# Patient Record
Sex: Female | Born: 1935
Health system: Southern US, Community
[De-identification: ages and names within clinical notes are randomized; demographics above are authoritative.]

---

## 1999-01-15 ENCOUNTER — Other Ambulatory Visit: Admission: RE | Admit: 1999-01-15 | Discharge: 1999-01-15 | Payer: Self-pay | Admitting: Obstetrics and Gynecology

## 1999-08-06 ENCOUNTER — Ambulatory Visit (HOSPITAL_COMMUNITY): Admission: RE | Admit: 1999-08-06 | Discharge: 1999-08-06 | Payer: Self-pay | Admitting: Internal Medicine

## 2000-05-04 ENCOUNTER — Ambulatory Visit (HOSPITAL_COMMUNITY): Admission: RE | Admit: 2000-05-04 | Discharge: 2000-05-04 | Payer: Self-pay | Admitting: Gastroenterology

## 2000-05-04 ENCOUNTER — Encounter (INDEPENDENT_AMBULATORY_CARE_PROVIDER_SITE_OTHER): Payer: Self-pay | Admitting: *Deleted

## 2000-06-13 ENCOUNTER — Ambulatory Visit (HOSPITAL_COMMUNITY): Admission: RE | Admit: 2000-06-13 | Discharge: 2000-06-13 | Payer: Self-pay | Admitting: Gastroenterology

## 2000-06-13 ENCOUNTER — Encounter: Payer: Self-pay | Admitting: Gastroenterology

## 2001-01-30 ENCOUNTER — Other Ambulatory Visit: Admission: RE | Admit: 2001-01-30 | Discharge: 2001-01-30 | Payer: Self-pay | Admitting: Obstetrics and Gynecology

## 2001-06-05 ENCOUNTER — Ambulatory Visit (HOSPITAL_COMMUNITY): Admission: RE | Admit: 2001-06-05 | Discharge: 2001-06-05 | Payer: Self-pay | Admitting: Gastroenterology

## 2002-02-16 ENCOUNTER — Encounter: Admission: RE | Admit: 2002-02-16 | Discharge: 2002-02-16 | Payer: Self-pay | Admitting: Obstetrics and Gynecology

## 2002-02-16 ENCOUNTER — Encounter: Payer: Self-pay | Admitting: Obstetrics and Gynecology

## 2005-03-11 ENCOUNTER — Other Ambulatory Visit: Admission: RE | Admit: 2005-03-11 | Discharge: 2005-03-11 | Payer: Self-pay | Admitting: Obstetrics and Gynecology

## 2007-03-27 ENCOUNTER — Other Ambulatory Visit
Admission: RE | Admit: 2007-03-27 | Discharge: 2007-03-27 | Payer: Self-pay | Admitting: Physical Medicine & Rehabilitation

## 2008-05-15 ENCOUNTER — Encounter: Admission: RE | Admit: 2008-05-15 | Discharge: 2008-05-15 | Payer: Self-pay | Admitting: Internal Medicine

## 2009-03-04 DIAGNOSIS — I10 Essential (primary) hypertension: Secondary | ICD-10-CM | POA: Insufficient documentation

## 2009-03-04 DIAGNOSIS — E785 Hyperlipidemia, unspecified: Secondary | ICD-10-CM | POA: Insufficient documentation

## 2009-03-04 DIAGNOSIS — K219 Gastro-esophageal reflux disease without esophagitis: Secondary | ICD-10-CM | POA: Insufficient documentation

## 2009-03-04 DIAGNOSIS — M1990 Unspecified osteoarthritis, unspecified site: Secondary | ICD-10-CM | POA: Insufficient documentation

## 2009-03-19 DIAGNOSIS — K760 Fatty (change of) liver, not elsewhere classified: Secondary | ICD-10-CM | POA: Insufficient documentation

## 2009-03-19 DIAGNOSIS — R7301 Impaired fasting glucose: Secondary | ICD-10-CM | POA: Insufficient documentation

## 2009-04-15 ENCOUNTER — Other Ambulatory Visit: Admission: RE | Admit: 2009-04-15 | Discharge: 2009-04-15 | Payer: Self-pay | Admitting: Obstetrics and Gynecology

## 2009-06-24 DIAGNOSIS — F4320 Adjustment disorder, unspecified: Secondary | ICD-10-CM | POA: Insufficient documentation

## 2010-10-02 NOTE — Procedures (Signed)
[place] [employer]  Patient:    Debra Buckley, Debra Buckley                        MRN: [id number] Proc. Date: 05/04/00 Adm. Date:  [id number] Attending:  Charna Elizabeth CC:         Lennon Alstrom. Felipa Eth, M.D.   Procedure Report  DATE OF BIRTH:  May 17, 1936.  PROCEDURE:  Colonoscopy.  ENDOSCOPIST:  Anselmo Rod, M.D.  INSTRUMENT USED:  Olympus video colonoscope.  INDICATION FOR PROCEDURE:  Guaiac-positive stools in a 75 year old white female.  Rule out colonic polyps, masses, hemorrhoids, etc.  PREPROCEDURE PREPARATION:  Informed consent was procured from the patient. The patient was fasted for eight hours prior to the procedure and prepped with a bottle of magnesium citrate and a gallon of NuLytely the night prior to the procedure.  PREPROCEDURE PHYSICAL:  VITAL SIGNS:  The patient had stable vital signs.  NECK:  Supple.  CHEST:  Clear to auscultation.  S1, S2 regular.  ABDOMEN:  Soft with normal abdominal bowel sounds.  DESCRIPTION OF PROCEDURE:  The patient was placed in the left lateral decubitus position and sedated with an additional 25 mg of Demerol and 2.5 mg of Versed intravenously.  Once the patient was adequately sedate and maintained on low-flow oxygen and continuous cardiac monitoring, the Olympus video colonoscope was advanced from the rectum to the cecum without difficulty.  There was a small amount of residual stool in the right colon and cecum.  The patients position was therefore turned from the left lateral to the supine position to facilitate adequate visualization of the cecal base. The appendiceal orifice and the ileocecal valve were clearly visualized and photographed.  The rest of the colonic mucosa appeared healthy except for a few scattered diverticula, with the predominance of the diverticular change in the left colon.  This was early diverticular disease.  There were small internal and external hemorrhoids seen on retroflexion and  anal inspection, respectively.  No other masses or polyps were recognized.  IMPRESSION: 1. Scattered diverticular disease with predominance of the change in the left    colon. 2. Small, nonbleeding internal and external hemorrhoids. 3. No masses or polyps seen. 4. Some residual stool in the right colon.  RECOMMENDATIONS: 1. The patient has been advised to increase the fluid and fiber in the diet. 2. Repeat guaiac testing will be done on an outpatient basis, and follow up in    the next month. DD:  05/04/00 TD:  05/04/00 Job: [id number]

## 2010-10-02 NOTE — Procedures (Signed)
[place]  Patient:    Debra Buckley, Debra Buckley                        MRN: [id number] Proc. Date: 05/04/00 Adm. Date:  [id number] Attending:  Charna Elizabeth CC:         Lennon Alstrom. Felipa Eth, M.D.   Procedure Report  DATE OF BIRTH:  1935-09-14.  REFERRING PHYSICIAN:  Ravi R. Felipa Eth, M.D.  PROCEDURE PERFORMED:  Esophagogastroduodenoscopy with biopsies.  ENDOSCOPIST:  Anselmo Rod, M.D.  INSTRUMENT USED:  Olympus video panendoscope.  INDICATIONS FOR PROCEDURE:  Epigastric discomfort not responding to PPIs and a history of guaiac positive stools in a 75 year old white female rule out peptic ulcer disease, esophagitis, gastritis, etc.  PREPROCEDURE PREPARATION:  Informed consent was procured from the patient. The patient was fasted for eight hours prior to the procedure.  PREPROCEDURE PHYSICAL:  The patient had stable vital signs.  Neck supple. Chest clear to auscultation.  S1, S2 regular.  Abdomen soft with normal abdominal bowel sounds.  DESCRIPTION OF PROCEDURE:  The patient was placed in left lateral decubitus position and sedated with 75 mg of Demerol and 7.5 mg of Versed intravenously. Once the patient was adequately sedated and maintained on low-flow oxygen and continuous cardiac monitoring, the Olympus video panendoscope was advanced through the mouthpiece, over the tongue, into the esophagus under direct vision.  The entire esophagus appeared normal without evidence of ring, stricture, masses, lesions or esophagitis or Barretts mucosa.  The scope was then advanced to the stomach where multiple small gastric polyps scattered throughout the gastric mucosa.  These were biopsied for pathology.  No erosions, ulcerations or masses were seen besides the gastric polyps mentioned above. There was antral gastritis.  The duodenal bulb and the small bowel distal to the bulb up to 60 cm appeared normal.  There was no outlet obstruction.  The patient tolerated  the procedure well without complications.  IMPRESSION: 1. Antral gastritis. 2. Multiple small sessile gastric polyps biopsied for pathology. 3. No hiatal hernia seen. 4. Normal-appearing esophagus and proximal small bowel.  RECOMMENDATION: 1. Await pathology results. 2. Avoid nonsteroidals. 3. Need proton pump inhibitor for now and follow antireflux measures. 4. Proceed with colonoscopy at this time. DD:  05/04/00 TD:  05/04/00 Job: [id number]

## 2011-01-25 DIAGNOSIS — M545 Low back pain, unspecified: Secondary | ICD-10-CM | POA: Insufficient documentation

## 2011-01-25 DIAGNOSIS — Z2839 Other underimmunization status: Secondary | ICD-10-CM | POA: Insufficient documentation

## 2011-09-17 DIAGNOSIS — J309 Allergic rhinitis, unspecified: Secondary | ICD-10-CM | POA: Insufficient documentation

## 2012-05-29 DIAGNOSIS — N3281 Overactive bladder: Secondary | ICD-10-CM | POA: Insufficient documentation

## 2014-03-12 DIAGNOSIS — M40209 Unspecified kyphosis, site unspecified: Secondary | ICD-10-CM | POA: Insufficient documentation

## 2014-03-12 DIAGNOSIS — H409 Unspecified glaucoma: Secondary | ICD-10-CM | POA: Insufficient documentation

## 2014-03-28 ENCOUNTER — Other Ambulatory Visit: Payer: Self-pay | Admitting: Sports Medicine

## 2014-03-28 DIAGNOSIS — M5410 Radiculopathy, site unspecified: Secondary | ICD-10-CM

## 2014-03-29 ENCOUNTER — Ambulatory Visit
Admission: RE | Admit: 2014-03-29 | Discharge: 2014-03-29 | Disposition: A | Discharge status: Home / Self Care | Payer: Medicare Other | Source: Ambulatory Visit | Attending: Sports Medicine | Admitting: Sports Medicine

## 2014-03-29 DIAGNOSIS — M5410 Radiculopathy, site unspecified: Secondary | ICD-10-CM

## 2015-10-03 DIAGNOSIS — Z0000 Encounter for general adult medical examination without abnormal findings: Secondary | ICD-10-CM | POA: Insufficient documentation

## 2015-10-09 DIAGNOSIS — D692 Other nonthrombocytopenic purpura: Secondary | ICD-10-CM | POA: Insufficient documentation

## 2016-03-29 ENCOUNTER — Other Ambulatory Visit: Payer: Self-pay | Admitting: Obstetrics & Gynecology

## 2016-03-29 DIAGNOSIS — Z1231 Encounter for screening mammogram for malignant neoplasm of breast: Secondary | ICD-10-CM

## 2016-06-01 DIAGNOSIS — Z01419 Encounter for gynecological examination (general) (routine) without abnormal findings: Secondary | ICD-10-CM | POA: Diagnosis not present

## 2016-06-01 DIAGNOSIS — L900 Lichen sclerosus et atrophicus: Secondary | ICD-10-CM | POA: Diagnosis not present

## 2016-06-01 DIAGNOSIS — Z1211 Encounter for screening for malignant neoplasm of colon: Secondary | ICD-10-CM | POA: Diagnosis not present

## 2016-06-02 DIAGNOSIS — L900 Lichen sclerosus et atrophicus: Secondary | ICD-10-CM | POA: Insufficient documentation

## 2016-06-07 ENCOUNTER — Ambulatory Visit
Admission: RE | Admit: 2016-06-07 | Discharge: 2016-06-07 | Disposition: A | Discharge status: Home / Self Care | Payer: PPO | Source: Ambulatory Visit | Attending: Obstetrics & Gynecology | Admitting: Obstetrics & Gynecology

## 2016-06-07 DIAGNOSIS — Z1231 Encounter for screening mammogram for malignant neoplasm of breast: Secondary | ICD-10-CM | POA: Diagnosis not present

## 2016-06-15 ENCOUNTER — Inpatient Hospital Stay
Admission: RE | Admit: 2016-06-15 | Discharge: 2016-06-15 | Disposition: A | Discharge status: Home / Self Care | Payer: Self-pay | Source: Ambulatory Visit | Attending: *Deleted | Admitting: *Deleted

## 2016-06-15 ENCOUNTER — Other Ambulatory Visit: Payer: Self-pay | Admitting: *Deleted

## 2016-06-15 DIAGNOSIS — Z9289 Personal history of other medical treatment: Secondary | ICD-10-CM

## 2016-07-07 DIAGNOSIS — H40003 Preglaucoma, unspecified, bilateral: Secondary | ICD-10-CM | POA: Diagnosis not present

## 2016-09-08 DIAGNOSIS — H40003 Preglaucoma, unspecified, bilateral: Secondary | ICD-10-CM | POA: Diagnosis not present

## 2016-09-22 DIAGNOSIS — H40003 Preglaucoma, unspecified, bilateral: Secondary | ICD-10-CM | POA: Diagnosis not present

## 2016-10-12 DIAGNOSIS — L720 Epidermal cyst: Secondary | ICD-10-CM | POA: Diagnosis not present

## 2016-10-12 DIAGNOSIS — H02826 Cysts of left eye, unspecified eyelid: Secondary | ICD-10-CM | POA: Diagnosis not present

## 2016-10-15 DIAGNOSIS — R7301 Impaired fasting glucose: Secondary | ICD-10-CM | POA: Diagnosis not present

## 2016-10-15 DIAGNOSIS — R8299 Other abnormal findings in urine: Secondary | ICD-10-CM | POA: Diagnosis not present

## 2016-10-15 DIAGNOSIS — E784 Other hyperlipidemia: Secondary | ICD-10-CM | POA: Diagnosis not present

## 2016-10-15 DIAGNOSIS — I10 Essential (primary) hypertension: Secondary | ICD-10-CM | POA: Diagnosis not present

## 2016-10-22 DIAGNOSIS — Z6838 Body mass index (BMI) 38.0-38.9, adult: Secondary | ICD-10-CM | POA: Diagnosis not present

## 2016-10-22 DIAGNOSIS — Z0000 Encounter for general adult medical examination without abnormal findings: Secondary | ICD-10-CM | POA: Diagnosis not present

## 2016-10-22 DIAGNOSIS — Z1389 Encounter for screening for other disorder: Secondary | ICD-10-CM | POA: Diagnosis not present

## 2016-10-22 DIAGNOSIS — J309 Allergic rhinitis, unspecified: Secondary | ICD-10-CM | POA: Diagnosis not present

## 2016-10-22 DIAGNOSIS — I10 Essential (primary) hypertension: Secondary | ICD-10-CM | POA: Diagnosis not present

## 2016-10-22 DIAGNOSIS — K760 Fatty (change of) liver, not elsewhere classified: Secondary | ICD-10-CM | POA: Diagnosis not present

## 2016-10-22 DIAGNOSIS — N3281 Overactive bladder: Secondary | ICD-10-CM | POA: Diagnosis not present

## 2016-10-22 DIAGNOSIS — E784 Other hyperlipidemia: Secondary | ICD-10-CM | POA: Diagnosis not present

## 2016-10-22 DIAGNOSIS — D692 Other nonthrombocytopenic purpura: Secondary | ICD-10-CM | POA: Diagnosis not present

## 2016-10-22 DIAGNOSIS — R7301 Impaired fasting glucose: Secondary | ICD-10-CM | POA: Diagnosis not present

## 2016-10-22 DIAGNOSIS — K219 Gastro-esophageal reflux disease without esophagitis: Secondary | ICD-10-CM | POA: Diagnosis not present

## 2016-11-30 DIAGNOSIS — L900 Lichen sclerosus et atrophicus: Secondary | ICD-10-CM | POA: Diagnosis not present

## 2017-03-22 DIAGNOSIS — H40003 Preglaucoma, unspecified, bilateral: Secondary | ICD-10-CM | POA: Diagnosis not present

## 2017-04-22 DIAGNOSIS — R7301 Impaired fasting glucose: Secondary | ICD-10-CM | POA: Diagnosis not present

## 2017-04-22 DIAGNOSIS — I10 Essential (primary) hypertension: Secondary | ICD-10-CM | POA: Diagnosis not present

## 2017-04-22 DIAGNOSIS — E7849 Other hyperlipidemia: Secondary | ICD-10-CM | POA: Diagnosis not present

## 2017-04-22 DIAGNOSIS — K219 Gastro-esophageal reflux disease without esophagitis: Secondary | ICD-10-CM | POA: Diagnosis not present

## 2017-04-22 DIAGNOSIS — M1990 Unspecified osteoarthritis, unspecified site: Secondary | ICD-10-CM | POA: Diagnosis not present

## 2017-04-22 DIAGNOSIS — Z6838 Body mass index (BMI) 38.0-38.9, adult: Secondary | ICD-10-CM | POA: Diagnosis not present

## 2017-04-22 DIAGNOSIS — K760 Fatty (change of) liver, not elsewhere classified: Secondary | ICD-10-CM | POA: Diagnosis not present

## 2017-04-22 DIAGNOSIS — J309 Allergic rhinitis, unspecified: Secondary | ICD-10-CM | POA: Diagnosis not present

## 2017-05-05 ENCOUNTER — Other Ambulatory Visit: Payer: Self-pay | Admitting: Obstetrics & Gynecology

## 2017-05-05 DIAGNOSIS — Z1231 Encounter for screening mammogram for malignant neoplasm of breast: Secondary | ICD-10-CM

## 2017-06-10 ENCOUNTER — Ambulatory Visit
Admission: RE | Admit: 2017-06-10 | Discharge: 2017-06-10 | Disposition: A | Discharge status: Home / Self Care | Payer: PPO | Source: Ambulatory Visit | Attending: Obstetrics & Gynecology | Admitting: Obstetrics & Gynecology

## 2017-06-10 DIAGNOSIS — Z1231 Encounter for screening mammogram for malignant neoplasm of breast: Secondary | ICD-10-CM | POA: Diagnosis not present

## 2017-06-23 DIAGNOSIS — Z01411 Encounter for gynecological examination (general) (routine) with abnormal findings: Secondary | ICD-10-CM | POA: Diagnosis not present

## 2017-06-23 DIAGNOSIS — L900 Lichen sclerosus et atrophicus: Secondary | ICD-10-CM | POA: Diagnosis not present

## 2017-06-23 DIAGNOSIS — Z1211 Encounter for screening for malignant neoplasm of colon: Secondary | ICD-10-CM | POA: Diagnosis not present

## 2017-11-18 DIAGNOSIS — E7849 Other hyperlipidemia: Secondary | ICD-10-CM | POA: Diagnosis not present

## 2017-11-18 DIAGNOSIS — R7301 Impaired fasting glucose: Secondary | ICD-10-CM | POA: Diagnosis not present

## 2017-11-18 DIAGNOSIS — I10 Essential (primary) hypertension: Secondary | ICD-10-CM | POA: Diagnosis not present

## 2017-11-18 DIAGNOSIS — Z13820 Encounter for screening for osteoporosis: Secondary | ICD-10-CM | POA: Diagnosis not present

## 2017-11-18 DIAGNOSIS — R82998 Other abnormal findings in urine: Secondary | ICD-10-CM | POA: Diagnosis not present

## 2017-11-25 DIAGNOSIS — Z6838 Body mass index (BMI) 38.0-38.9, adult: Secondary | ICD-10-CM | POA: Diagnosis not present

## 2017-11-25 DIAGNOSIS — I499 Cardiac arrhythmia, unspecified: Secondary | ICD-10-CM | POA: Diagnosis not present

## 2017-11-25 DIAGNOSIS — D692 Other nonthrombocytopenic purpura: Secondary | ICD-10-CM | POA: Diagnosis not present

## 2017-11-25 DIAGNOSIS — I10 Essential (primary) hypertension: Secondary | ICD-10-CM | POA: Diagnosis not present

## 2017-11-25 DIAGNOSIS — K760 Fatty (change of) liver, not elsewhere classified: Secondary | ICD-10-CM | POA: Diagnosis not present

## 2017-11-25 DIAGNOSIS — R7301 Impaired fasting glucose: Secondary | ICD-10-CM | POA: Diagnosis not present

## 2017-11-25 DIAGNOSIS — E7849 Other hyperlipidemia: Secondary | ICD-10-CM | POA: Diagnosis not present

## 2017-11-25 DIAGNOSIS — Z1212 Encounter for screening for malignant neoplasm of rectum: Secondary | ICD-10-CM | POA: Diagnosis not present

## 2017-11-25 DIAGNOSIS — Z1389 Encounter for screening for other disorder: Secondary | ICD-10-CM | POA: Diagnosis not present

## 2017-11-25 DIAGNOSIS — Z0000 Encounter for general adult medical examination without abnormal findings: Secondary | ICD-10-CM | POA: Diagnosis not present

## 2017-11-25 DIAGNOSIS — N3281 Overactive bladder: Secondary | ICD-10-CM | POA: Diagnosis not present

## 2017-11-25 DIAGNOSIS — K219 Gastro-esophageal reflux disease without esophagitis: Secondary | ICD-10-CM | POA: Diagnosis not present

## 2018-01-03 DIAGNOSIS — L900 Lichen sclerosus et atrophicus: Secondary | ICD-10-CM | POA: Diagnosis not present

## 2018-01-03 DIAGNOSIS — B372 Candidiasis of skin and nail: Secondary | ICD-10-CM | POA: Diagnosis not present

## 2018-04-18 DIAGNOSIS — H40003 Preglaucoma, unspecified, bilateral: Secondary | ICD-10-CM | POA: Diagnosis not present

## 2018-05-05 ENCOUNTER — Other Ambulatory Visit: Payer: Self-pay | Admitting: Obstetrics & Gynecology

## 2018-05-05 DIAGNOSIS — Z1231 Encounter for screening mammogram for malignant neoplasm of breast: Secondary | ICD-10-CM

## 2018-05-24 DIAGNOSIS — Z6838 Body mass index (BMI) 38.0-38.9, adult: Secondary | ICD-10-CM | POA: Diagnosis not present

## 2018-05-24 DIAGNOSIS — I499 Cardiac arrhythmia, unspecified: Secondary | ICD-10-CM | POA: Diagnosis not present

## 2018-05-24 DIAGNOSIS — K130 Diseases of lips: Secondary | ICD-10-CM | POA: Diagnosis not present

## 2018-05-24 DIAGNOSIS — I10 Essential (primary) hypertension: Secondary | ICD-10-CM | POA: Diagnosis not present

## 2018-05-24 DIAGNOSIS — R7301 Impaired fasting glucose: Secondary | ICD-10-CM | POA: Diagnosis not present

## 2018-05-24 DIAGNOSIS — J3089 Other allergic rhinitis: Secondary | ICD-10-CM | POA: Diagnosis not present

## 2018-06-12 ENCOUNTER — Ambulatory Visit
Admission: RE | Admit: 2018-06-12 | Discharge: 2018-06-12 | Disposition: A | Discharge status: Home / Self Care | Payer: PPO | Source: Ambulatory Visit | Attending: Obstetrics & Gynecology | Admitting: Obstetrics & Gynecology

## 2018-06-12 DIAGNOSIS — Z1231 Encounter for screening mammogram for malignant neoplasm of breast: Secondary | ICD-10-CM | POA: Diagnosis not present

## 2018-07-04 DIAGNOSIS — L900 Lichen sclerosus et atrophicus: Secondary | ICD-10-CM | POA: Diagnosis not present

## 2018-07-04 DIAGNOSIS — Z1211 Encounter for screening for malignant neoplasm of colon: Secondary | ICD-10-CM | POA: Diagnosis not present

## 2018-07-04 DIAGNOSIS — Z01419 Encounter for gynecological examination (general) (routine) without abnormal findings: Secondary | ICD-10-CM | POA: Diagnosis not present

## 2018-10-04 DIAGNOSIS — W19XXXA Unspecified fall, initial encounter: Secondary | ICD-10-CM | POA: Diagnosis not present

## 2018-10-04 DIAGNOSIS — M1811 Unilateral primary osteoarthritis of first carpometacarpal joint, right hand: Secondary | ICD-10-CM | POA: Diagnosis not present

## 2018-10-04 DIAGNOSIS — S63641A Sprain of metacarpophalangeal joint of right thumb, initial encounter: Secondary | ICD-10-CM | POA: Diagnosis not present

## 2018-10-04 DIAGNOSIS — S6991XA Unspecified injury of right wrist, hand and finger(s), initial encounter: Secondary | ICD-10-CM | POA: Diagnosis not present

## 2019-01-02 DIAGNOSIS — L900 Lichen sclerosus et atrophicus: Secondary | ICD-10-CM | POA: Diagnosis not present

## 2019-01-03 DIAGNOSIS — E7849 Other hyperlipidemia: Secondary | ICD-10-CM | POA: Diagnosis not present

## 2019-01-03 DIAGNOSIS — R7301 Impaired fasting glucose: Secondary | ICD-10-CM | POA: Diagnosis not present

## 2019-01-08 DIAGNOSIS — N3281 Overactive bladder: Secondary | ICD-10-CM | POA: Diagnosis not present

## 2019-01-08 DIAGNOSIS — D692 Other nonthrombocytopenic purpura: Secondary | ICD-10-CM | POA: Diagnosis not present

## 2019-01-08 DIAGNOSIS — K760 Fatty (change of) liver, not elsewhere classified: Secondary | ICD-10-CM | POA: Diagnosis not present

## 2019-01-08 DIAGNOSIS — I10 Essential (primary) hypertension: Secondary | ICD-10-CM | POA: Diagnosis not present

## 2019-01-08 DIAGNOSIS — H409 Unspecified glaucoma: Secondary | ICD-10-CM | POA: Diagnosis not present

## 2019-01-08 DIAGNOSIS — I499 Cardiac arrhythmia, unspecified: Secondary | ICD-10-CM | POA: Diagnosis not present

## 2019-01-08 DIAGNOSIS — K219 Gastro-esophageal reflux disease without esophagitis: Secondary | ICD-10-CM | POA: Diagnosis not present

## 2019-01-08 DIAGNOSIS — E785 Hyperlipidemia, unspecified: Secondary | ICD-10-CM | POA: Diagnosis not present

## 2019-01-08 DIAGNOSIS — Z0000 Encounter for general adult medical examination without abnormal findings: Secondary | ICD-10-CM | POA: Diagnosis not present

## 2019-01-08 DIAGNOSIS — R7301 Impaired fasting glucose: Secondary | ICD-10-CM | POA: Diagnosis not present

## 2019-01-08 DIAGNOSIS — Z1331 Encounter for screening for depression: Secondary | ICD-10-CM | POA: Diagnosis not present

## 2019-02-02 DIAGNOSIS — Z1212 Encounter for screening for malignant neoplasm of rectum: Secondary | ICD-10-CM | POA: Diagnosis not present

## 2019-02-15 ENCOUNTER — Other Ambulatory Visit: Payer: Self-pay

## 2019-02-15 DIAGNOSIS — Z20828 Contact with and (suspected) exposure to other viral communicable diseases: Secondary | ICD-10-CM | POA: Diagnosis not present

## 2019-02-15 DIAGNOSIS — Z20822 Contact with and (suspected) exposure to covid-19: Secondary | ICD-10-CM

## 2019-02-17 LAB — NOVEL CORONAVIRUS, NAA: SARS-CoV-2, NAA: NOT DETECTED

## 2019-02-19 ENCOUNTER — Telehealth: Payer: Self-pay | Admitting: Internal Medicine

## 2019-02-19 NOTE — Telephone Encounter (Signed)
Patient called in and received her covid test result  °

## 2019-04-20 DIAGNOSIS — H40003 Preglaucoma, unspecified, bilateral: Secondary | ICD-10-CM | POA: Diagnosis not present

## 2019-04-25 DIAGNOSIS — H40003 Preglaucoma, unspecified, bilateral: Secondary | ICD-10-CM | POA: Diagnosis not present

## 2019-05-21 ENCOUNTER — Other Ambulatory Visit: Payer: Self-pay | Admitting: Internal Medicine

## 2019-05-21 DIAGNOSIS — Z1231 Encounter for screening mammogram for malignant neoplasm of breast: Secondary | ICD-10-CM

## 2019-06-20 ENCOUNTER — Ambulatory Visit
Admission: RE | Admit: 2019-06-20 | Discharge: 2019-06-20 | Disposition: A | Discharge status: Home / Self Care | Payer: PPO | Source: Ambulatory Visit | Attending: Internal Medicine | Admitting: Internal Medicine

## 2019-06-20 DIAGNOSIS — Z1231 Encounter for screening mammogram for malignant neoplasm of breast: Secondary | ICD-10-CM

## 2019-06-26 DIAGNOSIS — G4700 Insomnia, unspecified: Secondary | ICD-10-CM | POA: Insufficient documentation

## 2019-06-26 DIAGNOSIS — M1990 Unspecified osteoarthritis, unspecified site: Secondary | ICD-10-CM | POA: Diagnosis not present

## 2019-06-26 DIAGNOSIS — R279 Unspecified lack of coordination: Secondary | ICD-10-CM | POA: Insufficient documentation

## 2019-06-26 DIAGNOSIS — K760 Fatty (change of) liver, not elsewhere classified: Secondary | ICD-10-CM | POA: Diagnosis not present

## 2019-06-26 DIAGNOSIS — K219 Gastro-esophageal reflux disease without esophagitis: Secondary | ICD-10-CM | POA: Diagnosis not present

## 2019-06-26 DIAGNOSIS — E785 Hyperlipidemia, unspecified: Secondary | ICD-10-CM | POA: Diagnosis not present

## 2019-06-26 DIAGNOSIS — R7301 Impaired fasting glucose: Secondary | ICD-10-CM | POA: Diagnosis not present

## 2019-06-26 DIAGNOSIS — I10 Essential (primary) hypertension: Secondary | ICD-10-CM | POA: Diagnosis not present

## 2019-07-10 DIAGNOSIS — L900 Lichen sclerosus et atrophicus: Secondary | ICD-10-CM | POA: Diagnosis not present

## 2019-07-10 DIAGNOSIS — E559 Vitamin D deficiency, unspecified: Secondary | ICD-10-CM | POA: Diagnosis not present

## 2019-07-10 DIAGNOSIS — Z23 Encounter for immunization: Secondary | ICD-10-CM | POA: Diagnosis not present

## 2019-07-10 DIAGNOSIS — Z1331 Encounter for screening for depression: Secondary | ICD-10-CM | POA: Diagnosis not present

## 2019-07-10 DIAGNOSIS — Z1211 Encounter for screening for malignant neoplasm of colon: Secondary | ICD-10-CM | POA: Diagnosis not present

## 2019-07-10 DIAGNOSIS — Z0000 Encounter for general adult medical examination without abnormal findings: Secondary | ICD-10-CM | POA: Diagnosis not present

## 2019-07-10 DIAGNOSIS — Z01419 Encounter for gynecological examination (general) (routine) without abnormal findings: Secondary | ICD-10-CM | POA: Diagnosis not present

## 2019-08-07 DIAGNOSIS — H903 Sensorineural hearing loss, bilateral: Secondary | ICD-10-CM | POA: Diagnosis not present

## 2019-08-09 DIAGNOSIS — H903 Sensorineural hearing loss, bilateral: Secondary | ICD-10-CM | POA: Diagnosis not present

## 2019-09-24 DIAGNOSIS — L820 Inflamed seborrheic keratosis: Secondary | ICD-10-CM | POA: Diagnosis not present

## 2019-12-18 DIAGNOSIS — L900 Lichen sclerosus et atrophicus: Secondary | ICD-10-CM | POA: Diagnosis not present

## 2020-01-28 DIAGNOSIS — R7301 Impaired fasting glucose: Secondary | ICD-10-CM | POA: Diagnosis not present

## 2020-01-28 DIAGNOSIS — I10 Essential (primary) hypertension: Secondary | ICD-10-CM | POA: Diagnosis not present

## 2020-01-28 DIAGNOSIS — E785 Hyperlipidemia, unspecified: Secondary | ICD-10-CM | POA: Diagnosis not present

## 2020-02-04 DIAGNOSIS — E785 Hyperlipidemia, unspecified: Secondary | ICD-10-CM | POA: Diagnosis not present

## 2020-02-04 DIAGNOSIS — I10 Essential (primary) hypertension: Secondary | ICD-10-CM | POA: Diagnosis not present

## 2020-02-04 DIAGNOSIS — Z23 Encounter for immunization: Secondary | ICD-10-CM | POA: Diagnosis not present

## 2020-02-04 DIAGNOSIS — M40209 Unspecified kyphosis, site unspecified: Secondary | ICD-10-CM | POA: Diagnosis not present

## 2020-02-04 DIAGNOSIS — R82998 Other abnormal findings in urine: Secondary | ICD-10-CM | POA: Diagnosis not present

## 2020-02-04 DIAGNOSIS — R279 Unspecified lack of coordination: Secondary | ICD-10-CM | POA: Diagnosis not present

## 2020-02-04 DIAGNOSIS — Z0000 Encounter for general adult medical examination without abnormal findings: Secondary | ICD-10-CM | POA: Diagnosis not present

## 2020-02-04 DIAGNOSIS — F4320 Adjustment disorder, unspecified: Secondary | ICD-10-CM | POA: Diagnosis not present

## 2020-02-04 DIAGNOSIS — D692 Other nonthrombocytopenic purpura: Secondary | ICD-10-CM | POA: Diagnosis not present

## 2020-02-04 DIAGNOSIS — M545 Low back pain: Secondary | ICD-10-CM | POA: Diagnosis not present

## 2020-02-04 DIAGNOSIS — K760 Fatty (change of) liver, not elsewhere classified: Secondary | ICD-10-CM | POA: Diagnosis not present

## 2020-02-04 DIAGNOSIS — N3281 Overactive bladder: Secondary | ICD-10-CM | POA: Diagnosis not present

## 2020-02-23 DIAGNOSIS — S46811A Strain of other muscles, fascia and tendons at shoulder and upper arm level, right arm, initial encounter: Secondary | ICD-10-CM | POA: Diagnosis not present

## 2020-03-20 DIAGNOSIS — L821 Other seborrheic keratosis: Secondary | ICD-10-CM | POA: Diagnosis not present

## 2020-03-20 DIAGNOSIS — D224 Melanocytic nevi of scalp and neck: Secondary | ICD-10-CM | POA: Diagnosis not present

## 2020-04-23 DIAGNOSIS — H40003 Preglaucoma, unspecified, bilateral: Secondary | ICD-10-CM | POA: Diagnosis not present

## 2020-05-01 DIAGNOSIS — H40003 Preglaucoma, unspecified, bilateral: Secondary | ICD-10-CM | POA: Diagnosis not present

## 2020-06-06 ENCOUNTER — Other Ambulatory Visit: Payer: Self-pay | Admitting: Obstetrics & Gynecology

## 2020-06-06 DIAGNOSIS — Z1231 Encounter for screening mammogram for malignant neoplasm of breast: Secondary | ICD-10-CM

## 2020-08-13 ENCOUNTER — Other Ambulatory Visit: Payer: Self-pay

## 2020-08-13 ENCOUNTER — Ambulatory Visit
Admission: RE | Admit: 2020-08-13 | Discharge: 2020-08-13 | Disposition: A | Discharge status: Home / Self Care | Payer: Medicare Other | Source: Ambulatory Visit | Attending: Obstetrics & Gynecology | Admitting: Obstetrics & Gynecology

## 2020-08-13 DIAGNOSIS — Z1231 Encounter for screening mammogram for malignant neoplasm of breast: Secondary | ICD-10-CM | POA: Insufficient documentation

## 2021-07-07 ENCOUNTER — Other Ambulatory Visit: Payer: Self-pay | Admitting: Obstetrics and Gynecology

## 2021-07-07 DIAGNOSIS — Z1231 Encounter for screening mammogram for malignant neoplasm of breast: Secondary | ICD-10-CM

## 2021-07-29 ENCOUNTER — Ambulatory Visit (INDEPENDENT_AMBULATORY_CARE_PROVIDER_SITE_OTHER): Payer: Medicare Other

## 2021-07-29 ENCOUNTER — Other Ambulatory Visit: Payer: Self-pay

## 2021-07-29 ENCOUNTER — Encounter: Payer: Self-pay | Admitting: Podiatry

## 2021-07-29 ENCOUNTER — Ambulatory Visit: Payer: Medicare Other | Admitting: Podiatry

## 2021-07-29 DIAGNOSIS — L97521 Non-pressure chronic ulcer of other part of left foot limited to breakdown of skin: Secondary | ICD-10-CM | POA: Diagnosis not present

## 2021-07-29 DIAGNOSIS — M5416 Radiculopathy, lumbar region: Secondary | ICD-10-CM | POA: Insufficient documentation

## 2021-07-29 DIAGNOSIS — M2042 Other hammer toe(s) (acquired), left foot: Secondary | ICD-10-CM | POA: Diagnosis not present

## 2021-07-29 DIAGNOSIS — L03032 Cellulitis of left toe: Secondary | ICD-10-CM | POA: Diagnosis not present

## 2021-07-29 DIAGNOSIS — L02612 Cutaneous abscess of left foot: Secondary | ICD-10-CM

## 2021-07-29 MED ORDER — MUPIROCIN 2 % EX OINT: 1.0000 "application " | TOPICAL_OINTMENT | Freq: Two times a day (BID) | CUTANEOUS | 0 refills | Status: DC

## 2021-07-29 MED ORDER — AMOXICILLIN-POT CLAVULANATE 875-125 MG PO TABS: 1.0000 | ORAL_TABLET | Freq: Two times a day (BID) | ORAL | 1 refills | Status: DC

## 2021-07-29 NOTE — Progress Notes (Signed)
?Subjective:  ?Patient ID: Debra Buckley, female    DOB: 1935-12-23,  MRN: [id number] ?HPI ?Chief Complaint  ?Patient presents with  ? Toe Pain  ?  2nd toe left - aching x several months, last week got red and swollen, callused tip and new sore next to the toenail, tried diclofenac gel - no help  ? New Patient (Initial Visit)  ? ? ?86 y.o. female presents with the above complaint.  ? ?ROS: Denies fever chills nausea vomiting muscle aches pains calf pain back pain chest pain shortness of breath. ? ?No past medical history on file. ?No past surgical history on file. ? ?Current Outpatient Medications:  ?  amoxicillin-clavulanate (AUGMENTIN) 875-125 MG tablet, Take 1 tablet by mouth 2 (two) times daily., Disp: 20 tablet, Rfl: 1 ?  aspirin EC 81 MG tablet, Take 81 mg by mouth daily. Swallow whole., Disp: , Rfl:  ?  BIOTIN PO, Take by mouth., Disp: , Rfl:  ?  Calcium-Magnesium-Vitamin D (CALCIUM 1200+D3 PO), Take by mouth., Disp: , Rfl:  ?  Cholecalciferol (D3 PO), Take by mouth., Disp: , Rfl:  ?  colestipol (COLESTID) 1 g tablet, Take 1 g by mouth 2 (two) times daily., Disp: , Rfl:  ?  FOLIC ACID PO, Take by mouth., Disp: , Rfl:  ?  MELATONIN PO, Take by mouth., Disp: , Rfl:  ?  mupirocin ointment (BACTROBAN) 2 %, Apply 1 application. topically 2 (two) times daily., Disp: 22 g, Rfl: 0 ?  Nutritional Supplements (GLUCOSAMINE COMPLEX PO), Take by mouth., Disp: , Rfl:  ?  gabapentin (NEURONTIN) 300 MG capsule, Take by mouth., Disp: , Rfl:  ?  irbesartan-hydrochlorothiazide (AVALIDE) 300-12.5 MG tablet, Take 1 tablet by mouth daily., Disp: , Rfl:  ?  omeprazole (PRILOSEC) 40 MG capsule, Take 40 mg by mouth daily., Disp: , Rfl:  ?  traMADol (ULTRAM) 50 MG tablet, Take 50 mg by mouth every 8 (eight) hours as needed., Disp: , Rfl:  ? ?Allergies  ?Allergen Reactions  ? Monascus Purpureus Went Yeast   ?  Other reaction(s): Myalgia  ? Statins   ?  Other reaction(s): Myalgia  ? Phenobarbital Itching and Rash  ? ?Review of  Systems ?Objective:  ?There were no vitals filed for this visit. ? ?General: Well developed, nourished, in no acute distress, alert and oriented x3  ? ?Dermatological: Skin is warm, dry and supple bilateral. Nails x 10 are well maintained; remaining integument appears unremarkable at this time. There are no open sores, no preulcerative lesions, no rash or signs of infection present.  Cellulitis with superficial ulceration to the distal and distal lateral aspect of the second digit left foot.  This is due to hammertoe deformity there was some purulence noted and a sample of this was sent for Gram stain with culture and sensitivity. ? ?Vascular: Dorsalis Pedis artery and Posterior Tibial artery pedal pulses are 2/4 bilateral with immedate capillary fill time. Pedal hair growth present. No varicosities and no lower extremity edema present bilateral.  ? ?Neruologic: Grossly intact via light touch bilateral. Vibratory intact via tuning fork bilateral. Protective threshold with Semmes Wienstein monofilament intact to all pedal sites bilateral. Patellar and Achilles deep tendon reflexes 2+ bilateral. No Babinski or clonus noted bilateral.  ? ?Musculoskeletal: No gross boney pedal deformities bilateral. No pain, crepitus, or limitation noted with foot and ankle range of motion bilateral. Muscular strength 5/5 in all groups tested bilateral.  Hammertoe deformity and a distal ulceration second toe left foot ? ?Gait:  Unassisted, Nonantalgic.  ? ? ?Radiographs: ? ?Radiographs demonstrate severe hammertoe deformities with no signs of osteoarthritis forefoot. ? ?Assessment & Plan:  ? ?Assessment: Hammertoe deformity with distal clavus and ulceration with cellulitis to the PIPJ ? ?Plan: Debridement of the wound today culture and sensitivity taken wrote a prescription for Bactroban ointment and Augmentin she will soak in Epsom salts and warm water apply dressing daily with Bactroban ointment follow-up with me in 1 week to make sure  she is improving we also provided her with a buttress pad to keep the toes from rubbing as much. ? ? ? ? ?Raney Koeppen T. [place], DPM ?

## 2021-08-02 LAB — WOUND CULTURE

## 2021-08-05 ENCOUNTER — Ambulatory Visit: Payer: Medicare Other | Admitting: Podiatry

## 2021-08-05 ENCOUNTER — Other Ambulatory Visit: Payer: Self-pay

## 2021-08-05 ENCOUNTER — Encounter: Payer: Self-pay | Admitting: Podiatry

## 2021-08-05 DIAGNOSIS — L97521 Non-pressure chronic ulcer of other part of left foot limited to breakdown of skin: Secondary | ICD-10-CM | POA: Diagnosis not present

## 2021-08-05 NOTE — Progress Notes (Signed)
She presents today for follow-up of her ulceration second toe left foot states that is doing a lot better. ? ?Objective: Vital signs are stable she is alert and oriented x3 much decrease in erythema no cellulitis drainage or odor continues to wear her buttress pad.  I debrided the ulceration does demonstrate a small 2 mm opening but has improved considerably.  Her culture and sensitivity came back positive for E. coli which was sensitive to Augmentin. ? ?Assessment: Hammertoe deformity distal clavus and ulceration. ? ?Plan: Debrided today she will continue the Augmentin continue soaking the dressing on a daily basis with Bactroban follow-up with her in about 2 to 3 weeks. ?

## 2021-08-14 ENCOUNTER — Ambulatory Visit
Admission: RE | Admit: 2021-08-14 | Discharge: 2021-08-14 | Disposition: A | Discharge status: Home / Self Care | Payer: Medicare Other | Source: Ambulatory Visit | Attending: Obstetrics and Gynecology | Admitting: Obstetrics and Gynecology

## 2021-08-14 DIAGNOSIS — Z1231 Encounter for screening mammogram for malignant neoplasm of breast: Secondary | ICD-10-CM | POA: Diagnosis present

## 2021-08-19 ENCOUNTER — Encounter: Payer: Self-pay | Admitting: Podiatry

## 2021-08-19 ENCOUNTER — Ambulatory Visit: Payer: Medicare Other | Admitting: Podiatry

## 2021-08-19 DIAGNOSIS — L97521 Non-pressure chronic ulcer of other part of left foot limited to breakdown of skin: Secondary | ICD-10-CM

## 2021-08-19 MED ORDER — AMOXICILLIN-POT CLAVULANATE 875-125 MG PO TABS: 1.0000 | ORAL_TABLET | Freq: Two times a day (BID) | ORAL | 0 refills | Status: DC

## 2021-08-19 NOTE — Progress Notes (Signed)
She presents today for follow-up of ulceration to the distal aspect of the second toe left foot states that is doing much better she is very happy with the outcome thus far she states is not well yet but is doing better. ? ?Objective: Pulses are palpable left rigid hammertoe deformities resulting in distal clavus and ultimate ulceration.  There is mild erythema along the lateral aspect of the toenail today but all in all it appears to be doing much better. ? ?Assessment: Well-healing distal ulceration. ? ?Plan: Debrided the wound today and placed office made buttress pads to help hold the toe.  We will follow-up with her in a few weeks and we are going to start restart antibiotics ?

## 2021-09-02 ENCOUNTER — Encounter: Payer: Self-pay | Admitting: Podiatry

## 2021-09-02 ENCOUNTER — Ambulatory Visit: Payer: Medicare Other | Admitting: Podiatry

## 2021-09-02 DIAGNOSIS — L97521 Non-pressure chronic ulcer of other part of left foot limited to breakdown of skin: Secondary | ICD-10-CM | POA: Diagnosis not present

## 2021-09-02 MED ORDER — AMOXICILLIN-POT CLAVULANATE 875-125 MG PO TABS: 1.0000 | ORAL_TABLET | Freq: Two times a day (BID) | ORAL | 0 refills | Status: AC

## 2021-09-02 NOTE — Progress Notes (Signed)
She presents today for follow-up of ulceration second toe left foot.  States that it looks better it is still a little bit red I finished only antibiotics and is still real tender.  She states that she is wearing this type of device as she shows me a buttress pad that she bought from the store. ? ?Objective: Vital signs are stable alert and oriented x3.  Pulses are palpable.  There is no erythema edema cellulitis drainage or odor.  There is just a mild purulence from the distalmost aspect of the second toe which appears to be relatively rigid. ? ?Assessment: Mild ulceration with very minimal drainage. ? ?Plan: Discussed etiology pathology and surgical therapies at this point were going to go ahead and run another dose of antibiotic.  And she will continue the use of her buttress pad. ?

## 2021-09-15 ENCOUNTER — Telehealth: Payer: Self-pay | Admitting: Podiatry

## 2021-09-15 NOTE — Telephone Encounter (Signed)
Patient called and stated that she needs a refill on mupirocin ointment (BACTROBAN) 2 %. She is going out of town Saturday and would like to have it. ? ?Please advise  ?

## 2021-09-16 MED ORDER — MUPIROCIN 2 % EX OINT: 1.0000 "application " | TOPICAL_OINTMENT | Freq: Two times a day (BID) | CUTANEOUS | 0 refills | Status: AC

## 2021-09-16 NOTE — Addendum Note (Signed)
Addended bySherryle Lis, Sakara Lehtinen R on: 09/16/2021 07:33 AM ? ? Modules accepted: Orders ? ?

## 2021-09-30 ENCOUNTER — Encounter: Payer: Self-pay | Admitting: Podiatry

## 2021-09-30 ENCOUNTER — Ambulatory Visit: Payer: Medicare Other | Admitting: Podiatry

## 2021-09-30 DIAGNOSIS — L97521 Non-pressure chronic ulcer of other part of left foot limited to breakdown of skin: Secondary | ICD-10-CM

## 2021-09-30 NOTE — Progress Notes (Signed)
She presents today for follow-up of her distal clavus second digit of her left foot.  States that seems to be doing a lot better is just mildly tender. ? ?Objective: Vital signs are stable alert oriented x3 there is no erythema edema cellulitis drainage or odor reactive hyperkeratotic tissue was debrided today to no open lesion. ? ?Assessment: Hammertoe deformity rigid nature secondary to osteoarthritis distal clavus has healed from the ulceration. ? ?Plan: Debrided the lesion today and replace her buttress pad.  Follow-up with me in about a month ?

## 2021-10-28 ENCOUNTER — Ambulatory Visit: Payer: Medicare Other | Admitting: Podiatry

## 2021-11-03 ENCOUNTER — Ambulatory Visit: Payer: Medicare Other | Admitting: Podiatry

## 2021-11-03 DIAGNOSIS — M2042 Other hammer toe(s) (acquired), left foot: Secondary | ICD-10-CM

## 2021-11-03 DIAGNOSIS — L97521 Non-pressure chronic ulcer of other part of left foot limited to breakdown of skin: Secondary | ICD-10-CM | POA: Diagnosis not present

## 2021-11-03 NOTE — Progress Notes (Signed)
She presents today for follow-up of her distal clavus second digit of her left foot.  States that seems to be doing a lot bette.  It is no longer tender.  Objective: Vital signs are stable alert oriented x3 there is no erythema edema cellulitis drainage or odor reactive hyperkeratotic tissue was debrided today to no open lesion.  Assessment: Hammertoe deformity rigid nature secondary to osteoarthritis distal clavus has healed from the ulceration.  Plan: Skin has completely reepithelialized.  Continue using buttress padding.  Follow-up with Dr. Milinda Pointer as needed.  I discussed prevention technique and shoe gear modification.  She states understanding.

## 2022-07-12 ENCOUNTER — Encounter: Payer: Self-pay | Admitting: Internal Medicine

## 2022-07-12 ENCOUNTER — Other Ambulatory Visit: Payer: Self-pay | Admitting: Internal Medicine

## 2022-07-12 DIAGNOSIS — Z1231 Encounter for screening mammogram for malignant neoplasm of breast: Secondary | ICD-10-CM

## 2022-07-14 ENCOUNTER — Ambulatory Visit: Payer: Medicare Other | Admitting: Podiatry

## 2022-07-14 ENCOUNTER — Encounter: Payer: Self-pay | Admitting: Podiatry

## 2022-07-14 DIAGNOSIS — L97521 Non-pressure chronic ulcer of other part of left foot limited to breakdown of skin: Secondary | ICD-10-CM | POA: Diagnosis not present

## 2022-07-14 NOTE — Progress Notes (Signed)
She presents today complaining of a painful toe second digit of the left foot.  States that the sulcus pads really help but she states that the whole toe is just tender and sore.  Objective: Vital signs are stable alert oriented x 3.  Pulses are palpable.  Reactive benign skin lesion to the distal aspect of the toe exquisitely tender on palpation.  She has rigid hammertoe deformity with osteoarthritis of the PIPJ and a plantarflexed reposition.  Assessment: Is a painful clavus distal aspect second toe left foot.  Osteoarthritis PIPJ second toe left foot.  Plan: Debrided benign skin lesion today encouraged her to continue the use of the sulcus pads.  Will follow-up with [place] on an as-needed basis.

## 2022-07-27 IMAGING — MG MM DIGITAL SCREENING BILAT W/ TOMO AND CAD
6 of 12 series · 6 of 36 positions shown · non-contrast
Comparison: Previous exam(s).

CLINICAL DATA: Screening.

EXAM:
DIGITAL SCREENING BILATERAL MAMMOGRAM WITH TOMOSYNTHESIS AND CAD
TECHNIQUE: Bilateral screening digital craniocaudal and mediolateral oblique
mammograms were obtained. Bilateral screening digital breast
tomosynthesis was performed. The images were evaluated with
computer-aided detection.

[L CC synth-2D]
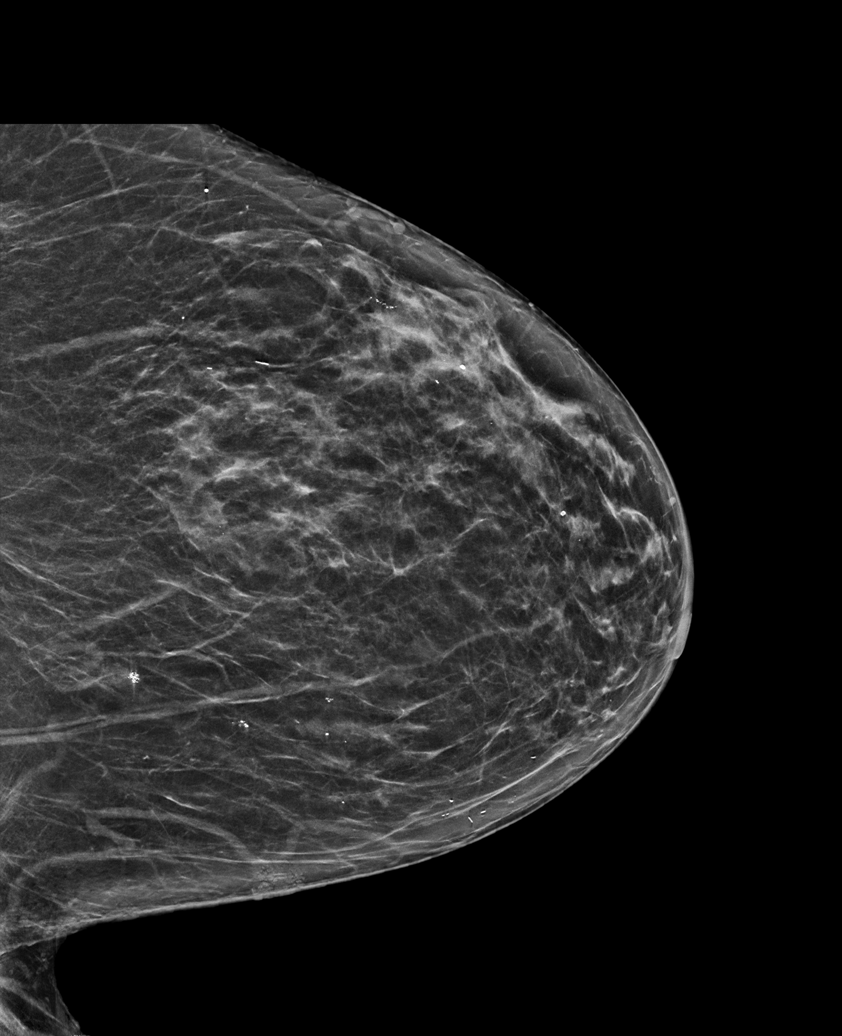

[R CC synth-2D]
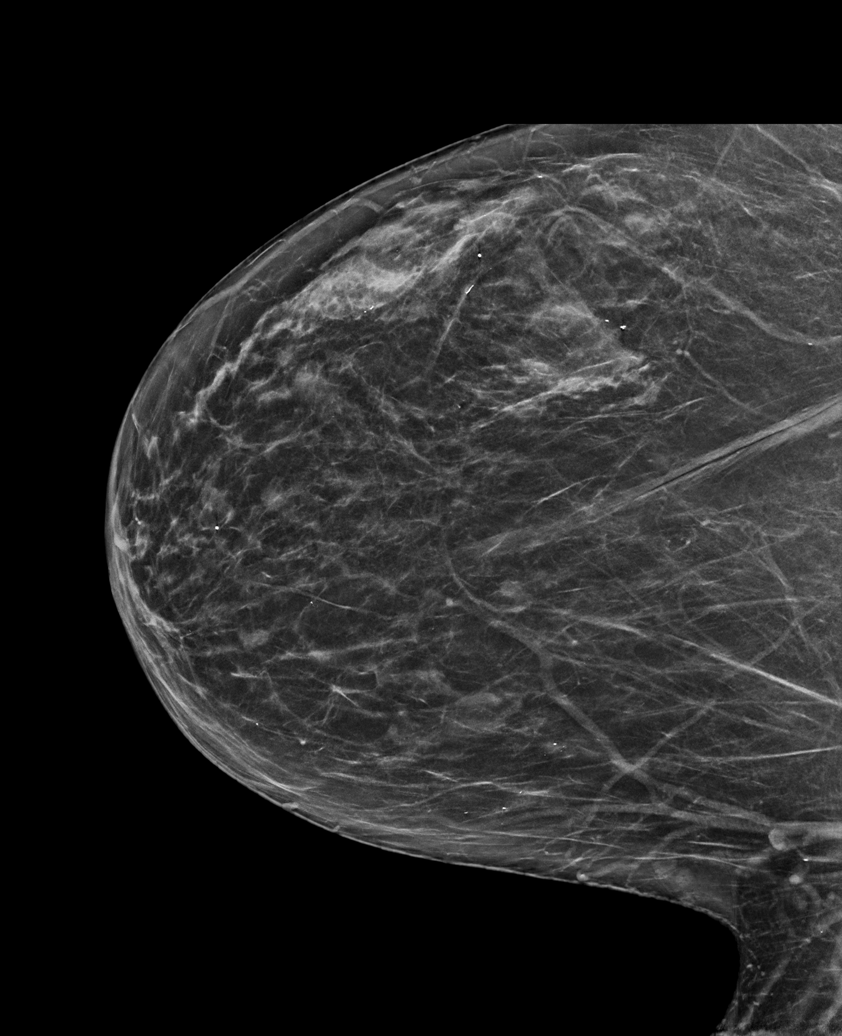

[L MLO synth-2D]
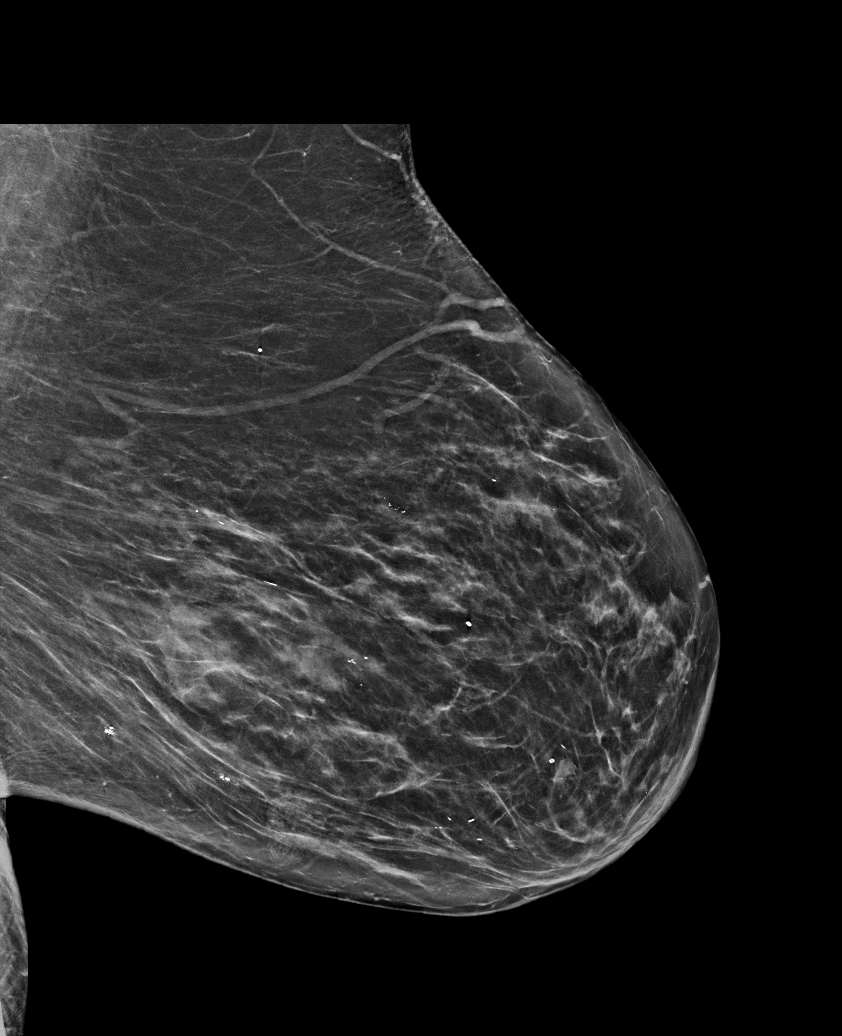

[R XCCL synth-2D]
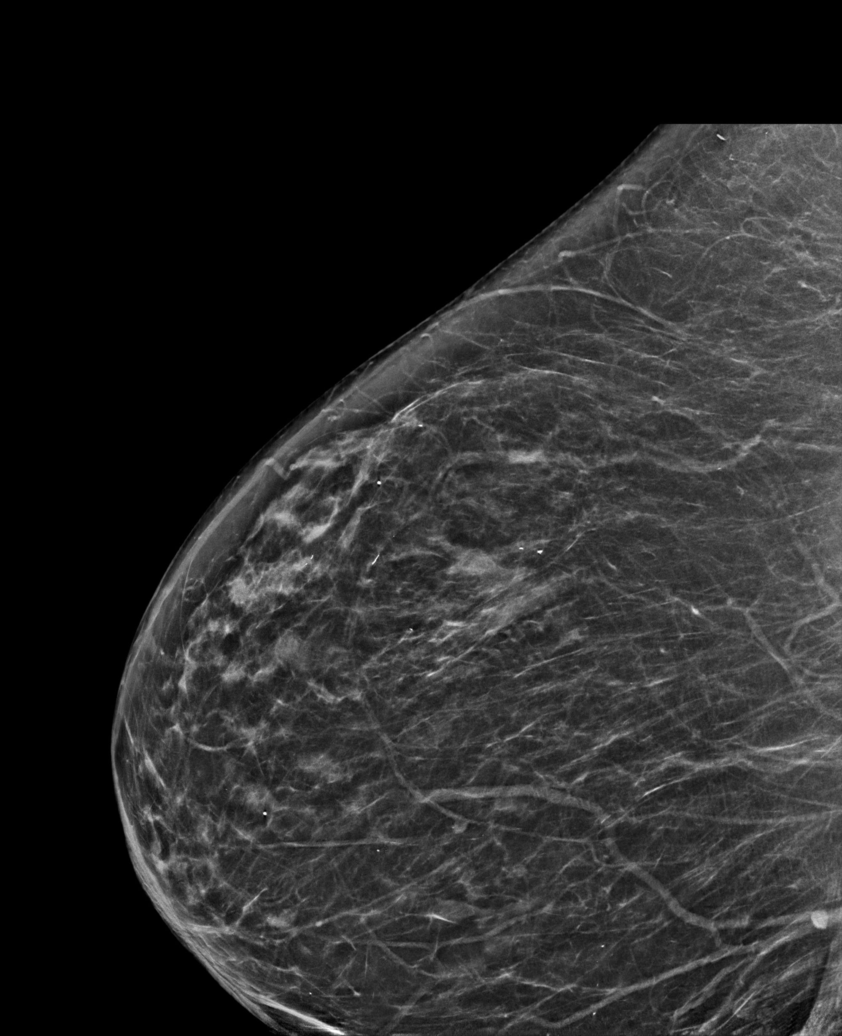

[R MLO synth-2D]
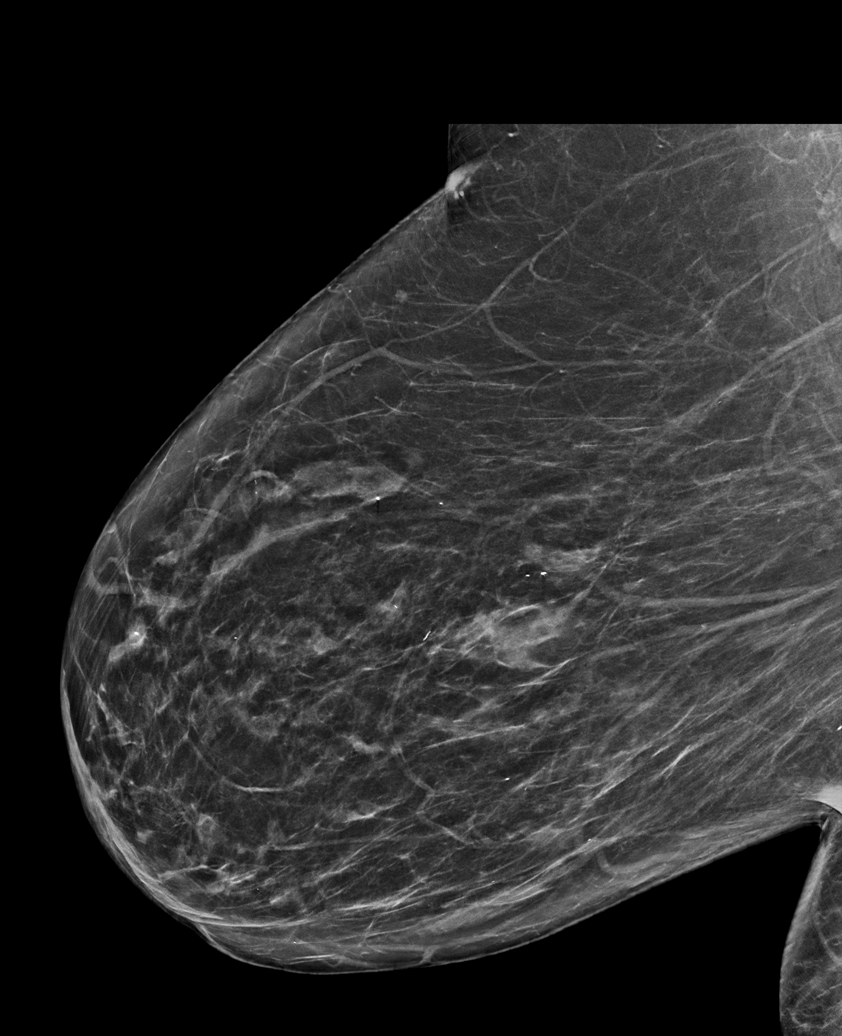

[L XCCL synth-2D]
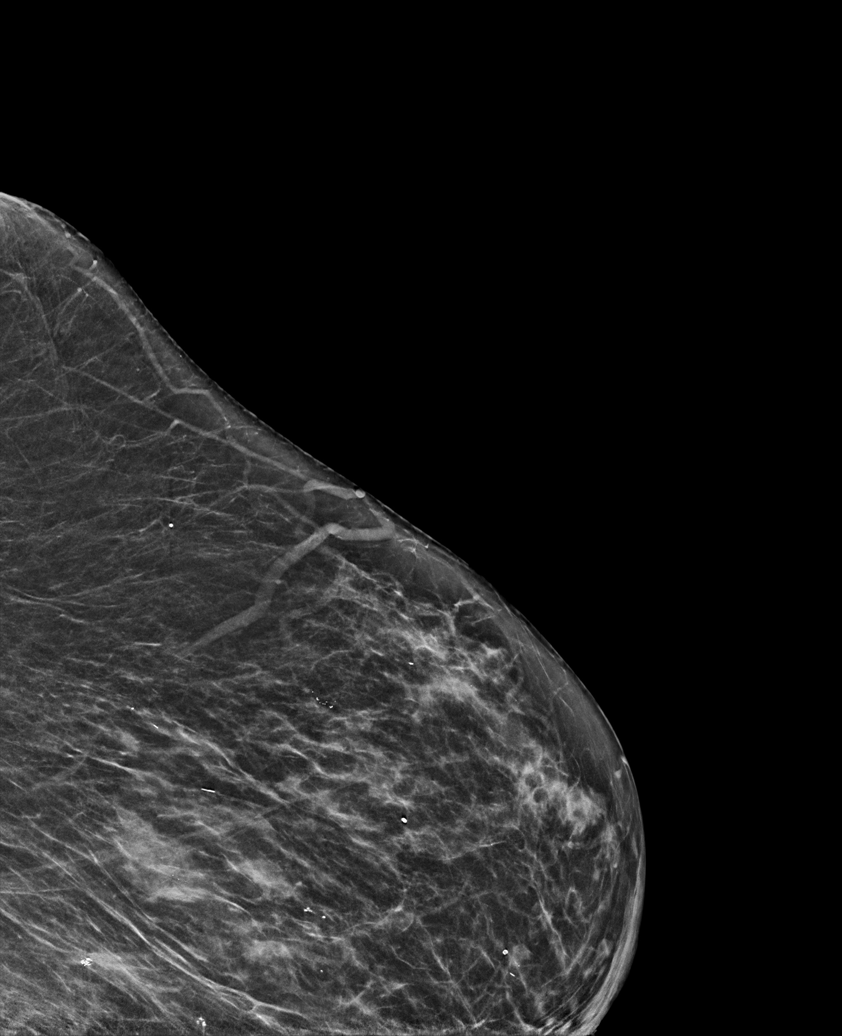

[6 of 36 positions shown; findings below may reference images not displayed]

ACR Breast Density Category b: There are scattered areas of
fibroglandular density.
FINDINGS: There are no findings suspicious for malignancy.
IMPRESSION: No mammographic evidence of malignancy. A result letter of this
screening mammogram will be mailed directly to the patient.

RECOMMENDATION:
Screening mammogram in one year. (Code:51-O-LD2)

BI-RADS CATEGORY  1: Negative.

## 2022-08-16 ENCOUNTER — Ambulatory Visit
Admission: RE | Admit: 2022-08-16 | Discharge: 2022-08-16 | Disposition: A | Discharge status: Home / Self Care | Payer: Medicare Other | Source: Ambulatory Visit | Attending: Internal Medicine | Admitting: Internal Medicine

## 2022-08-16 DIAGNOSIS — Z1231 Encounter for screening mammogram for malignant neoplasm of breast: Secondary | ICD-10-CM | POA: Insufficient documentation

## 2023-07-08 ENCOUNTER — Other Ambulatory Visit: Payer: Self-pay | Admitting: Internal Medicine

## 2023-07-08 DIAGNOSIS — Z1231 Encounter for screening mammogram for malignant neoplasm of breast: Secondary | ICD-10-CM

## 2023-08-17 ENCOUNTER — Ambulatory Visit
Admission: RE | Admit: 2023-08-17 | Discharge: 2023-08-17 | Disposition: A | Discharge status: Home / Self Care | Payer: Medicare Other | Source: Ambulatory Visit | Attending: Internal Medicine | Admitting: Internal Medicine

## 2023-08-17 DIAGNOSIS — Z1231 Encounter for screening mammogram for malignant neoplasm of breast: Secondary | ICD-10-CM | POA: Diagnosis present
